# Patient Record
Sex: Male | Born: 1985 | Race: Black or African American | Hispanic: No | Marital: Single | Smoking: Current every day smoker
Health system: Southern US, Community
[De-identification: ages and names within clinical notes are randomized; demographics above are authoritative.]

---

## 2017-09-24 ENCOUNTER — Other Ambulatory Visit: Payer: Self-pay

## 2017-09-24 ENCOUNTER — Emergency Department: Payer: Medicaid Other

## 2017-09-24 ENCOUNTER — Emergency Department
Admission: EM | Admit: 2017-09-24 | Discharge: 2017-09-24 | Disposition: A | Discharge status: Home / Self Care | Payer: Medicaid Other | Attending: Emergency Medicine | Admitting: Emergency Medicine

## 2017-09-24 DIAGNOSIS — F17210 Nicotine dependence, cigarettes, uncomplicated: Secondary | ICD-10-CM | POA: Insufficient documentation

## 2017-09-24 DIAGNOSIS — K551 Chronic vascular disorders of intestine: Secondary | ICD-10-CM | POA: Diagnosis not present

## 2017-09-24 DIAGNOSIS — K529 Noninfective gastroenteritis and colitis, unspecified: Secondary | ICD-10-CM | POA: Diagnosis not present

## 2017-09-24 DIAGNOSIS — R109 Unspecified abdominal pain: Secondary | ICD-10-CM | POA: Diagnosis present

## 2017-09-24 LAB — CBC
HEMATOCRIT: 45.4 % (ref 40.0–52.0)
HEMOGLOBIN: 15.6 g/dL (ref 13.0–18.0)
MCH: 30.1 pg (ref 26.0–34.0)
MCHC: 34.3 g/dL (ref 32.0–36.0)
MCV: 87.7 fL (ref 80.0–100.0)
Platelets: 279 10*3/uL (ref 150–440)
RBC: 5.18 MIL/uL (ref 4.40–5.90)
RDW: 14 % (ref 11.5–14.5)
WBC: 16.1 10*3/uL — ABNORMAL HIGH (ref 3.8–10.6)

## 2017-09-24 LAB — COMPREHENSIVE METABOLIC PANEL
ALBUMIN: 4.4 g/dL (ref 3.5–5.0)
ALK PHOS: 49 U/L (ref 38–126)
ALT: 19 U/L (ref 17–63)
AST: 27 U/L (ref 15–41)
Anion gap: 10 (ref 5–15)
BILIRUBIN TOTAL: 0.7 mg/dL (ref 0.3–1.2)
BUN: 16 mg/dL (ref 6–20)
CALCIUM: 9.2 mg/dL (ref 8.9–10.3)
CO2: 21 mmol/L — AB (ref 22–32)
Chloride: 105 mmol/L (ref 101–111)
Creatinine, Ser: 0.88 mg/dL (ref 0.61–1.24)
GFR calc Af Amer: >60 mL/min (ref >60)
GFR calc non Af Amer: >60 mL/min (ref >60)
GLUCOSE: 119 mg/dL — AB (ref 65–99)
Potassium: 4.1 mmol/L (ref 3.5–5.1)
SODIUM: 136 mmol/L (ref 135–145)
TOTAL PROTEIN: 7.5 g/dL (ref 6.5–8.1)

## 2017-09-24 LAB — LIPASE, BLOOD: Lipase: 18 U/L (ref 11–51)

## 2017-09-24 MED ORDER — ONDANSETRON HCL 4 MG/2ML IJ SOLN
4.0000 mg | Freq: Once | INTRAMUSCULAR | Status: AC
  Administered 2017-09-24: 4 mg via INTRAVENOUS
  Filled 2017-09-24: qty 2

## 2017-09-24 MED ORDER — IBUPROFEN 600 MG PO TABS: 600.0000 mg | ORAL_TABLET | Freq: Four times a day (QID) | ORAL | 0 refills | Status: AC | PRN

## 2017-09-24 MED ORDER — IOPAMIDOL (ISOVUE-300) INJECTION 61%
75.0000 mL | Freq: Once | INTRAVENOUS | Status: AC | PRN
  Administered 2017-09-24: 75 mL via INTRAVENOUS

## 2017-09-24 MED ORDER — KETOROLAC TROMETHAMINE 30 MG/ML IJ SOLN
15.0000 mg | Freq: Once | INTRAMUSCULAR | Status: AC
  Administered 2017-09-24: 15 mg via INTRAVENOUS
  Filled 2017-09-24: qty 1

## 2017-09-24 MED ORDER — ONDANSETRON 4 MG PO TBDP: 4.0000 mg | ORAL_TABLET | Freq: Three times a day (TID) | ORAL | 0 refills | Status: AC | PRN

## 2017-09-24 MED ORDER — SODIUM CHLORIDE 0.9 % IV BOLUS (SEPSIS)
1000.0000 mL | Freq: Once | INTRAVENOUS | Status: AC
  Administered 2017-09-24: 1000 mL via INTRAVENOUS

## 2017-09-24 NOTE — ED Notes (Signed)
Patient transported to CT 

## 2017-09-24 NOTE — ED Provider Notes (Signed)
[facility] Emergency Department Provider Note  ____________________________________________  Time seen: Approximately 1:37 PM  I have reviewed the triage vital signs and the nursing notes.   HISTORY  Chief Complaint Abdominal Pain   HPI William Kline is a 31 y.o. 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 Patient reports the pain started this morning and is sharp and located in the center of his abdomen, constant and nonradiating, currently moderate in intensity. Associated with 1 episode of nonbloody nonbilious emesis and 2 of watery diarrhea. No melena, no coffee ground emesis, no hematochezia. No dysuria or hematuria, no fever or chills. No CP or SOB  History reviewed. No pertinent past medical history.  There are no active problems to display for this patient.   History reviewed. No pertinent surgical history.  Prior to Admission medications   Medication Sig Start Date End Date Taking? Authorizing Provider  ibuprofen (ADVIL,MOTRIN) 600 MG tablet Take 1 tablet (600 mg total) every 6 (six) hours as needed by mouth. 09/24/17   Don PerkingVeronese, [place], MD  ondansetron (ZOFRAN ODT) 4 MG disintegrating tablet Take 1 tablet (4 mg total) every 8 (eight) hours as needed by mouth for nausea or vomiting. 09/24/17   Nita SickleVeronese, [place], MD    Allergies Fish allergy and Pineapple  No family history on file.  Social History Social History   Tobacco Use  . Smoking status: Current Every Day Smoker  Substance Use Topics  . Alcohol use: No    Frequency: Never  . Drug use: Not on file    Review of Systems  Constitutional: Negative for fever. Eyes: Negative for visual changes. ENT: Negative for sore throat. Neck: No neck pain  Cardiovascular: Negative for chest pain. Respiratory: Negative for shortness of breath. Gastrointestinal: + abdominal pain, vomiting and diarrhea. Genitourinary: Negative for  dysuria. Musculoskeletal: Negative for back pain. Skin: Negative for rash. Neurological: Negative for headaches, weakness or numbness. Psych: No SI or HI  ____________________________________________   PHYSICAL EXAM:  VITAL SIGNS: ED Triage Vitals [09/24/17 1119]  Enc Vitals Group     BP 131/81     Pulse Rate (!) 125     Resp 20     Temp 99.9 F (37.7 C)     Temp Source Oral     SpO2 98 %     Weight 120 lb (54.4 kg)     Height 5\' 8"  (1.727 m)     Head Circumference      Peak Flow      Pain Score 8     Pain Loc      Pain Edu?      Excl. in GC?     Constitutional: Alert and oriented. Well appearing and in no apparent distress. HEENT:      Head: Normocephalic and atraumatic.         Eyes: Conjunctivae are normal. Sclera is non-icteric.       Mouth/Throat: Mucous membranes are moist.       Neck: Supple with no signs of meningismus. Cardiovascular: Tachycardic with regular rhythm. No murmurs, gallops, or rubs. 2+ symmetrical distal pulses are present in all extremities. No JVD. Respiratory: Normal respiratory effort. Lungs are clear to auscultation bilaterally. No wheezes, crackles, or rhonchi.  Gastrointestinal: Soft, tender in the epigastric region and left quadrants, and non distended with positive bowel sounds. No rebound or guarding. Genitourinary: No CVA tenderness. Musculoskeletal: Nontender with normal range of motion in all extremities. No edema,  cyanosis, or erythema of extremities. Neurologic: Normal speech and language. Face is symmetric. Moving all extremities. No gross focal neurologic deficits are appreciated. Skin: Skin is warm, dry and intact. No rash noted. Psychiatric: Mood and affect are normal. Speech and behavior are normal.  ____________________________________________   LABS (all labs ordered are listed, but only abnormal results are displayed)  Labs Reviewed  COMPREHENSIVE METABOLIC PANEL - Abnormal; Notable for the following components:       Result Value   CO2 21 (*)    Glucose, Bld 119 (*)    All other components within normal limits  CBC - Abnormal; Notable for the following components:   WBC 16.1 (*)    All other components within normal limits  LIPASE, BLOOD  URINALYSIS, COMPLETE (UACMP) WITH MICROSCOPIC   ____________________________________________  EKG  ED ECG REPORT I, Nita Sicklearolina Evoleht Hovatter, the attending physician, personally viewed and interpreted this ECG.  Normal sinus rhythm, rate of 98, normal intervals, normal axis, no ST elevations or depressions.  ____________________________________________  RADIOLOGY  CT a/p: 1. Moderate amount of food material and fluid within the stomach and fluid within the colon -could be related to gastroenteritis. No other bowel abnormalities including bowel obstruction, inflammation or wall thickening. Normal appendix. 2. Otherwise normal CT of the abdomen and pelvis with contrast.  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 Patient is tachycardic and has a low-grade fever (heart rate of 125, temp 99.9). His abdomen is soft and tender to palpation of the epigastric and left quadrants with no rebound or guarding. Labs show a white count of 16, normal CMP and lipase. UA is pending. Differential diagnoses including diverticulitis versus gastritis versus peptic ulcer disease versus gastroenteritis. We'll give IV fluids, Toradol, Zofran, and sent patient for CT abdomen and pelvis  Clinical Course as of Sep 24 1532  Thu Sep 24, 2017  1521 CT with no acute findings other than gastritis. Patient's vitals have improved with Toradol and fluids and his heart  rates currently 80. Patient reports that his pain is significantly improved. No vomiting or diarrhea in the emergency room. [facility] with Zofran and ibuprofen and increase by mouth hydration. Discussed return precautions with patient and guard in the room.  [CV]    Clinical Course User Index [CV] Don PerkingVeronese, [place], MD     As part of my medical decision making, I reviewed the following data within the electronic MEDICAL RECORD NUMBER Nursing notes reviewed and incorporated, Labs reviewed , EKG interpreted , Radiograph reviewed , Notes from prior ED visits and [place] Controlled Substance Database    Pertinent labs & imaging results that were available during my care of the patient were reviewed by me and considered in my medical decision making (see chart for details).    ____________________________________________   FINAL CLINICAL IMPRESSION(S) / ED DIAGNOSES  Final diagnoses:  Gastroenteritis      NEW MEDICATIONS STARTED DURING THIS VISIT:  This SmartLink is deprecated. Use AVSMEDLIST instead to display the medication list for a patient.   Note:  This document was prepared using Dragon voice recognition software and may include unintentional dictation errors.    Nita SickleVeronese, [place], MD 09/24/17 [phone]

## 2017-09-24 NOTE — ED Triage Notes (Signed)
Pt c/o abdominal pain over past few days, worsening this AM at 2AM. Nausea, dry heaves, and constipation. Last normal BM was X 3 days ago. Pt reports he was in process for testing to diagnose SMA syndrome prior to [place] [facility]. Pt alert and oriented X4, active, cooperative, pt in NAD. RR even and unlabored, color WNL.

## 2019-01-04 IMAGING — CT CT ABD-PELV W/ CM
2 of 4 series · 16 of 46 positions shown, 18 images · IV contrast (APPLIED)
Comparison: None.

CLINICAL DATA: 31-year-old male with acute abdominal pain for
several days. Associated nausea and constipation.

EXAM:
CT ABDOMEN AND PELVIS WITH CONTRAST
TECHNIQUE: Multidetector CT imaging of the abdomen and pelvis was performed
using the standard protocol following bolus administration of
intravenous contrast.
CONTRAST:  75mL [id number] IOPAMIDOL ([id number]) INJECTION 61%

[Series 2: routine abd/pel with · axial · 0.68mm/px · z∈[-1001,-561]mm · 13 of 98 slices shown, 15 images]
[im 5/98  soft-tissue]
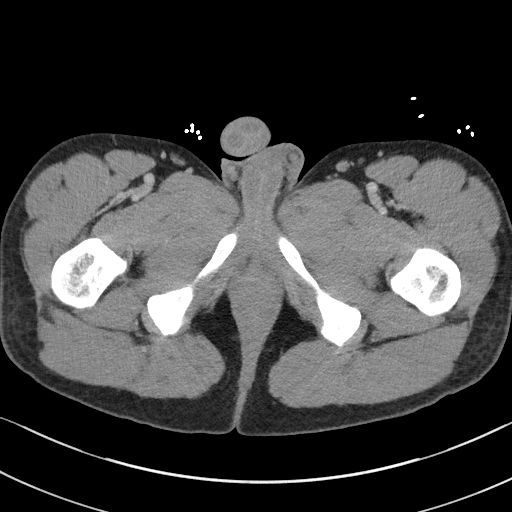
[im 5/98  bone]
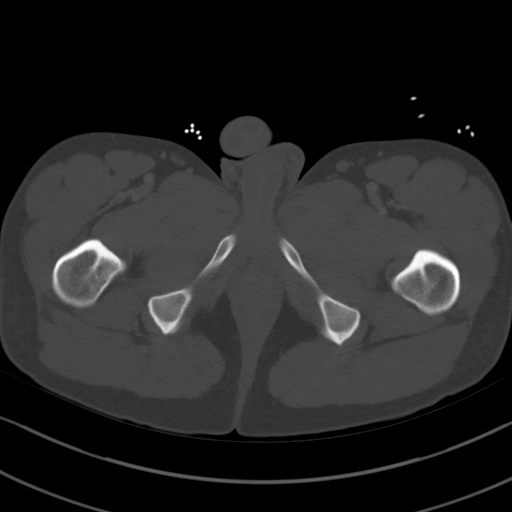
[im 13/98  soft-tissue]
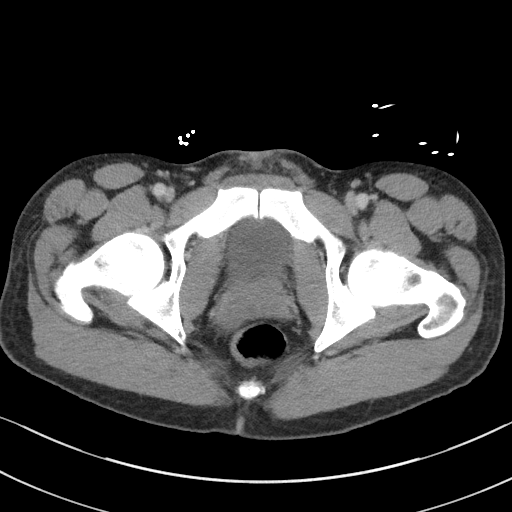
[im 22/98  soft-tissue]
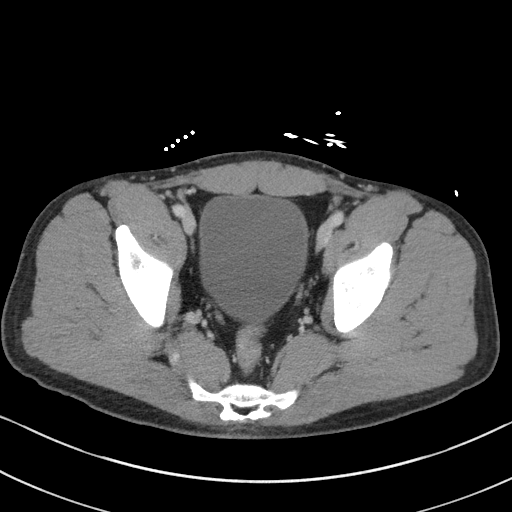
[im 26/98  soft-tissue]
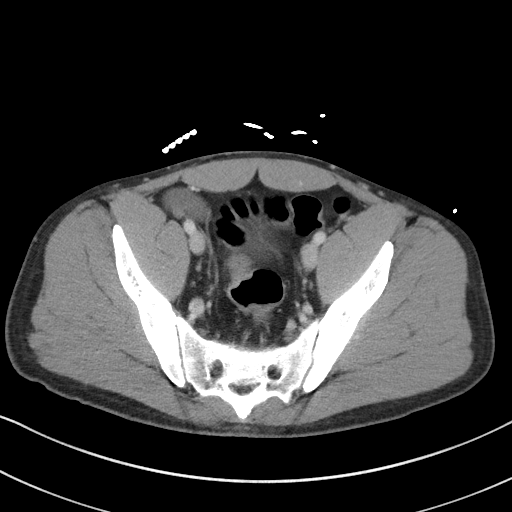
[im 34/98  soft-tissue]
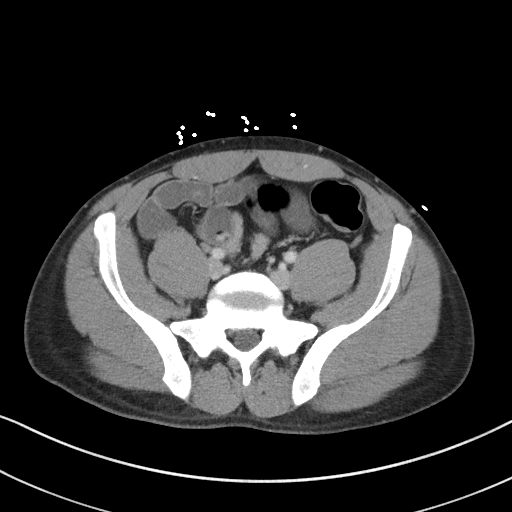
[im 43/98  soft-tissue]
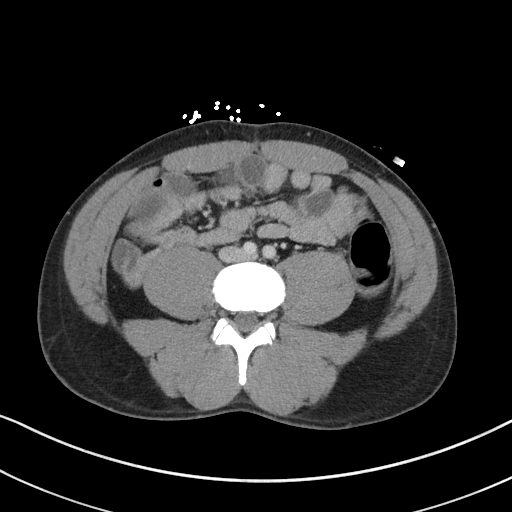
[im 51/98  soft-tissue]
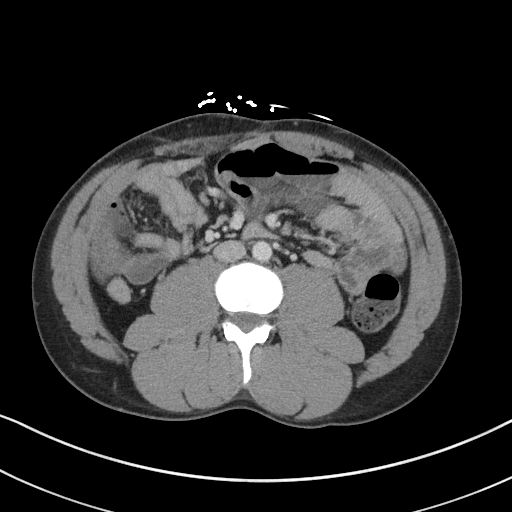
[im 55/98  soft-tissue]
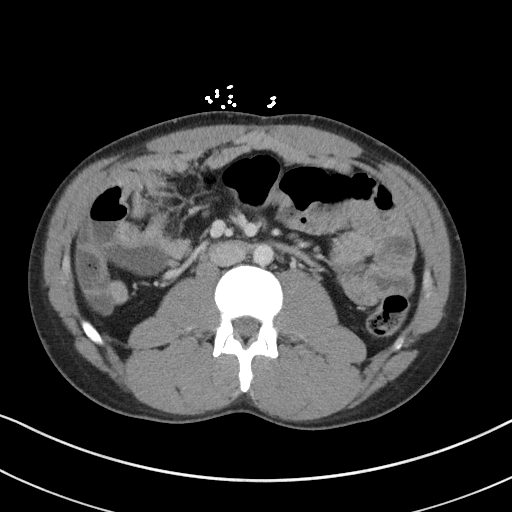
[im 64/98  soft-tissue]
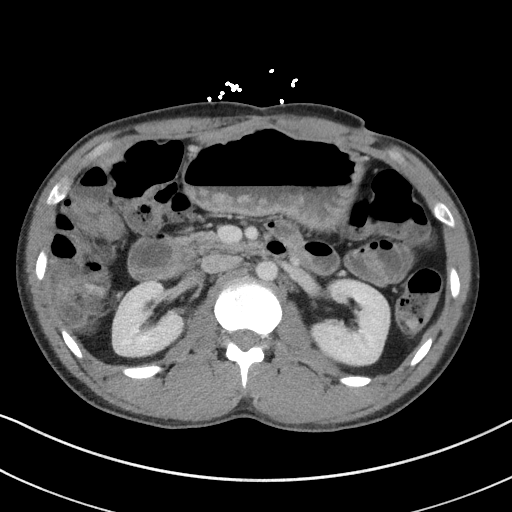
[im 64/98  bone]
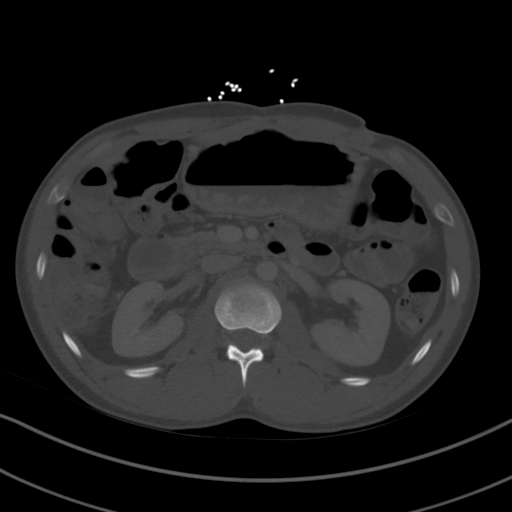
[im 72/98  soft-tissue]
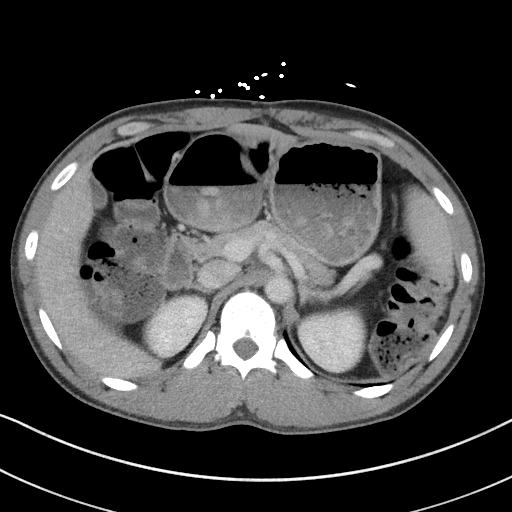
[im 76/98  soft-tissue]
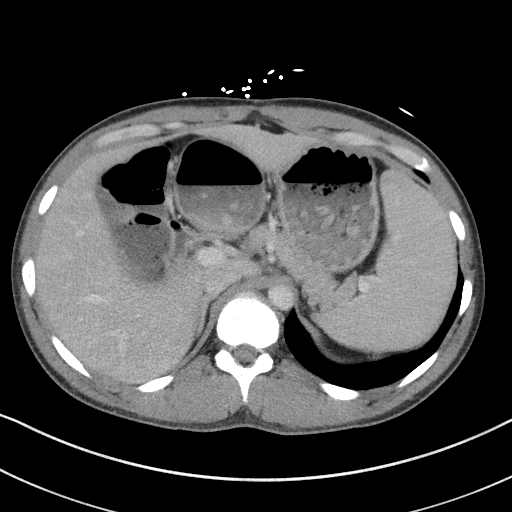
[im 85/98  soft-tissue]
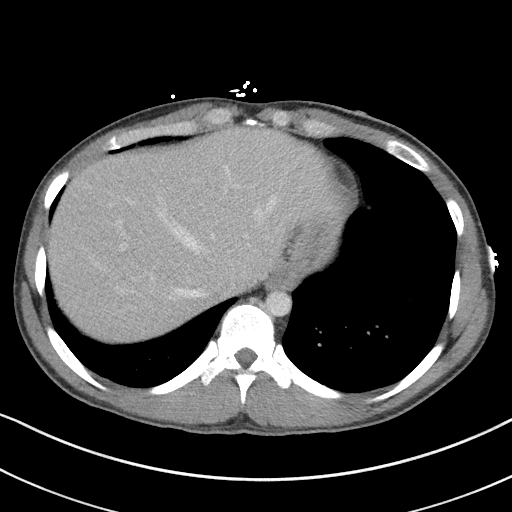
[im 93/98  soft-tissue]
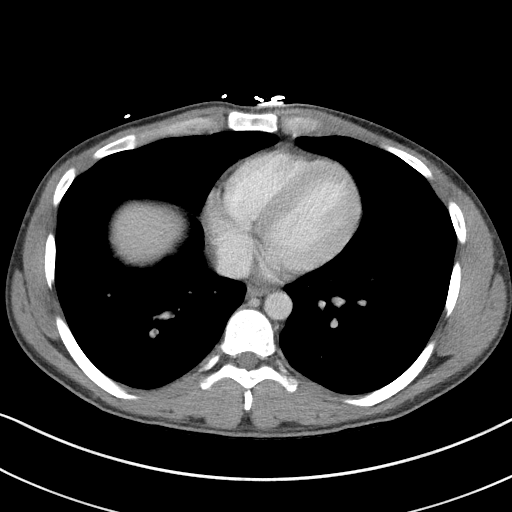

[Series 5: coronal st · coronal · 0.71mm/px · 3 of 74 slices shown]
[im 25/74  soft-tissue]
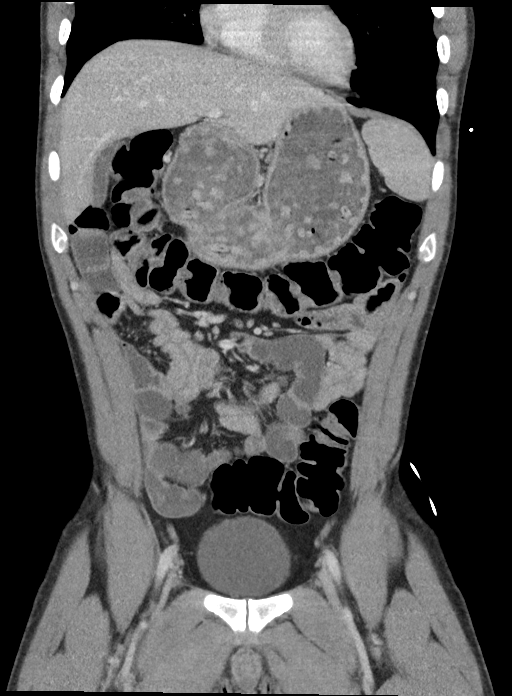
[im 33/74  soft-tissue]
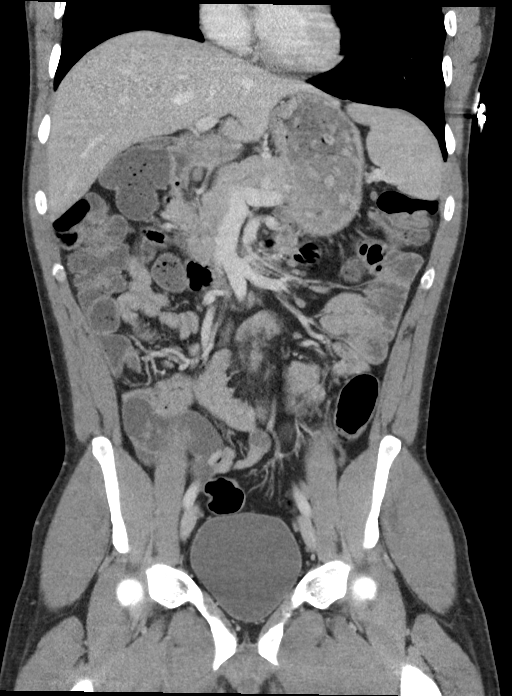
[im 41/74  soft-tissue]
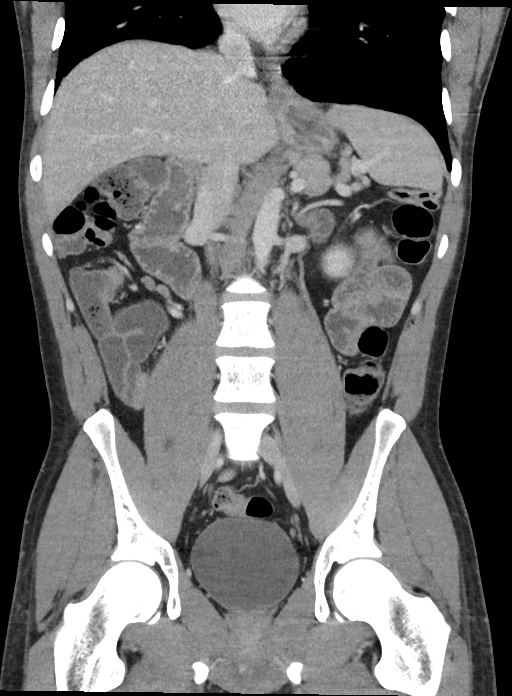

[16 of 46 positions shown; findings below may reference images not displayed]

FINDINGS: Lower chest: Unremarkable.

Hepatobiliary: The liver and gallbladder are unremarkable. No
biliary dilatation.

Pancreas: Unremarkable

Spleen: Unremarkable

Adrenals/Urinary Tract: The kidneys, adrenal glands and bladder are
unremarkable.

Stomach/Bowel: A moderate amount of food material and fluid within
the stomach noted. There is no evidence of bowel wall thickening or
bowel obstruction. No bowel inflammatory changes are identified.
Fluid and small amount of stool within the colon noted. The appendix
is normal.

Vascular/Lymphatic: No significant vascular findings are identified.
The visualized mesenteric arteries and superior mesenteric vein are
patent. No enlarged lymph nodes are identified.

Reproductive: Prostate is unremarkable.

Other: No free fluid

Musculoskeletal: No acute or significant osseous findings.
IMPRESSION: 1. Moderate amount of food material and fluid within the stomach and
fluid within the colon -could be related to gastroenteritis. No
other bowel abnormalities including bowel obstruction, inflammation
or wall thickening. Normal appendix.
2. Otherwise normal CT of the abdomen and pelvis with contrast.
